# Patient Record
Sex: Female | Born: 2005 | Hispanic: Yes | Marital: Single | Smoking: Never smoker
Health system: Southern US, Community
[De-identification: ages and names within clinical notes are randomized; demographics above are authoritative.]

## PROBLEM LIST (undated history)

## (undated) DIAGNOSIS — D649 Anemia, unspecified: Secondary | ICD-10-CM

## (undated) HISTORY — DX: Anemia, unspecified: D64.9

---
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
## 2023-06-12 ENCOUNTER — Observation Stay (HOSPITAL_COMMUNITY)
Admission: EM | Admit: 2023-06-12 | Discharge: 2023-06-13 | Disposition: A | Discharge status: Home / Self Care | Attending: Emergency Medicine | Admitting: Emergency Medicine

## 2023-06-12 ENCOUNTER — Encounter (HOSPITAL_COMMUNITY): Payer: Self-pay

## 2023-06-12 ENCOUNTER — Other Ambulatory Visit: Payer: Self-pay

## 2023-06-12 DIAGNOSIS — D649 Anemia, unspecified: Secondary | ICD-10-CM | POA: Diagnosis not present

## 2023-06-12 DIAGNOSIS — D500 Iron deficiency anemia secondary to blood loss (chronic): Secondary | ICD-10-CM | POA: Diagnosis not present

## 2023-06-12 DIAGNOSIS — R55 Syncope and collapse: Principal | ICD-10-CM

## 2023-06-12 LAB — COMPREHENSIVE METABOLIC PANEL WITH GFR
ALT: 14 U/L (ref 0–44)
AST: 21 U/L (ref 15–41)
Albumin: 4.1 g/dL (ref 3.5–5.0)
Alkaline Phosphatase: 48 U/L (ref 38–126)
Anion gap: 9 (ref 5–15)
BUN: 6 mg/dL (ref 6–20)
CO2: 23 mmol/L (ref 22–32)
Calcium: 9.2 mg/dL (ref 8.9–10.3)
Chloride: 105 mmol/L (ref 98–111)
Creatinine, Ser: 0.63 mg/dL (ref 0.44–1.00)
GFR, Estimated: >60 mL/min (ref >60)
Glucose, Bld: 102 mg/dL — ABNORMAL HIGH (ref 70–99)
Potassium: 4.1 mmol/L (ref 3.5–5.1)
Sodium: 137 mmol/L (ref 135–145)
Total Bilirubin: 0.6 mg/dL (ref 0.0–1.2)
Total Protein: 7.2 g/dL (ref 6.5–8.1)

## 2023-06-12 LAB — URINALYSIS, ROUTINE W REFLEX MICROSCOPIC
Bilirubin Urine: NEGATIVE
Glucose, UA: NEGATIVE mg/dL
Ketones, ur: NEGATIVE mg/dL
Nitrite: NEGATIVE
Protein, ur: 30 mg/dL — AB
Specific Gravity, Urine: 1.024 (ref 1.005–1.030)
pH: 6 (ref 5.0–8.0)

## 2023-06-12 LAB — HCG, SERUM, QUALITATIVE: Preg, Serum: NEGATIVE

## 2023-06-12 LAB — CBC
HCT: 26.9 % — ABNORMAL LOW (ref 36.0–46.0)
Hemoglobin: 7.4 g/dL — ABNORMAL LOW (ref 12.0–15.0)
MCH: 17.6 pg — ABNORMAL LOW (ref 26.0–34.0)
MCHC: 27.5 g/dL — ABNORMAL LOW (ref 30.0–36.0)
MCV: 63.9 fL — ABNORMAL LOW (ref 80.0–100.0)
Platelets: 346 10*3/uL (ref 150–400)
RBC: 4.21 MIL/uL (ref 3.87–5.11)
RDW: 21.2 % — ABNORMAL HIGH (ref 11.5–15.5)
WBC: 5.9 10*3/uL (ref 4.0–10.5)
nRBC: 0 % (ref 0.0–0.2)

## 2023-06-12 LAB — ABO/RH: ABO/RH(D): O POS

## 2023-06-12 LAB — RETICULOCYTES
Immature Retic Fract: 20.4 % — ABNORMAL HIGH (ref 2.3–15.9)
RBC.: 4.19 MIL/uL (ref 3.87–5.11)
Retic Count, Absolute: 93.4 10*3/uL (ref 19.0–186.0)
Retic Ct Pct: 2.2 % (ref 0.4–3.1)

## 2023-06-12 LAB — CBG MONITORING, ED: Glucose-Capillary: 79 mg/dL (ref 70–99)

## 2023-06-12 LAB — FOLATE: Folate: 20.2 ng/mL (ref >5.9)

## 2023-06-12 LAB — PREPARE RBC (CROSSMATCH)

## 2023-06-12 MED ORDER — ACETAMINOPHEN 650 MG RE SUPP: 650.0000 mg | Freq: Four times a day (QID) | RECTAL | Status: DC | PRN

## 2023-06-12 MED ORDER — ACETAMINOPHEN 325 MG PO TABS: 650.0000 mg | ORAL_TABLET | Freq: Four times a day (QID) | ORAL | Status: DC | PRN

## 2023-06-12 MED ORDER — SODIUM CHLORIDE 0.9% IV SOLUTION: Freq: Once | INTRAVENOUS | Status: DC

## 2023-06-12 MED ORDER — SENNOSIDES-DOCUSATE SODIUM 8.6-50 MG PO TABS
1.0000 | ORAL_TABLET | Freq: Every evening | ORAL | Status: DC | PRN
Start: 2023-06-12 — End: 2023-06-13

## 2023-06-12 NOTE — Hospital Course (Signed)
 Kathryn Andrade is a 18 y.o. woman with a history of menorrhagia who presented with syncope and admitted on 6/1 with severe blood loss anemia. She received 2u PRBCs and discharged to home on 06/13/23.   Syncope 2/2 chronic blood loss and iron-deficient anemia History of menorrhagia Presented with hemoglobin of 7.4, symptomatic anemia with shortness of breath, lightheadedness, and syncopal episode. No other evident sources of bleeding. Suspect she is chronically anemic as a result of menorrhagia. History not consistent with an underlying clotting issue. Very iron deficient on iron studies, and consistently low MCV. She responded well to 2u PRBC as well as 500mg  venofer iron infusion. - Discharge with PCP visit planned on 6/5. - Outgoing meds: ferrous sulfate 325 daily as tolerated and Junel OCP to improve menorrhagia. - Consider referral to OB/GYN upon pt/provider discretion - Ref made for outpatient IV iron infusion - Rec high iron diet

## 2023-06-12 NOTE — ED Provider Notes (Signed)
 [place] EMERGENCY DEPARTMENT AT [facility] Provider Note   CSN: [id number] Arrival date & time: 06/12/23  1400     History  Chief Complaint  Patient presents with   Loss of Consciousness    Kathryn Andrade is a 18 y.o. female.  Patient reports that she passed out twice today.  Patient reports that she was in the shower and she bent over.  Patient began experiencing shortness of breath.  Patient states that she rinsed off and got out of the shower.  Patient states she felt herself becoming weak.  Patient reports falling to the ground.  She states she hit her chin.  Patient reports that she was able to get up and then she had a second episode.  Patient reports that she did not lose consciousness the second time she fell she just felt weak and collapsed to the ground.  Patient reports the first time she may have lost consciousness.  Patient reports experiencing shortness of breath and feeling like she was breathing hard before the episode happened.  Patient is here with her mother.  Her mother states that she eats a lot of ice.  Mother is concerned that she is anemic.  The history is provided by the patient. No language interpreter was used.  Loss of Consciousness Episode history:  Multiple Most recent episode:  Today Duration:  5 seconds Progression:  Unchanged Chronicity:  New Context: not sight of blood   Relieved by:  Nothing Worsened by:  Nothing Associated symptoms: no weakness        Home Medications Prior to Admission medications   Medication Sig Start Date End Date Taking? Authorizing Provider  baclofen  (LIORESAL ) 10 MG tablet Take 1 tablet (10 mg total) by mouth 2 (two) times daily. 08/17/21   Raspet, Erin K, PA-C      Allergies    Patient has no known allergies.    Review of Systems   Review of Systems  Cardiovascular:  Positive for syncope.  Neurological:  Negative for weakness.  All other systems reviewed and are negative.   Physical  Exam Updated Vital Signs BP 128/66 (BP Location: Right Arm)   Pulse (!) 101   Temp 98.7 F (37.1 C)   Resp 14   Ht 5\' 3"  (1.6 m)   Wt 49.9 kg   SpO2 100%   BMI 19.49 kg/m  Physical Exam Vitals and nursing note reviewed.  Constitutional:      Appearance: She is well-developed.  HENT:     Head: Normocephalic.  Cardiovascular:     Rate and Rhythm: Normal rate.  Pulmonary:     Effort: Pulmonary effort is normal.  Abdominal:     General: There is no distension.  Musculoskeletal:        General: Normal range of motion.     Cervical back: Normal range of motion.  Skin:    General: Skin is warm.  Neurological:     General: No focal deficit present.     Mental Status: She is alert and oriented to person, place, and time.     ED Results / Procedures / Treatments   Labs (all labs ordered are listed, but only abnormal results are displayed) Labs Reviewed  CBC - Abnormal; Notable for the following components:      Result Value   Hemoglobin 7.4 (*)    HCT 26.9 (*)    MCV 63.9 (*)    MCH 17.6 (*)    MCHC 27.5 (*)  RDW 21.2 (*)    All other components within normal limits  URINALYSIS, ROUTINE W REFLEX MICROSCOPIC - Abnormal; Notable for the following components:   Color, Urine AMBER (*)    APPearance HAZY (*)    Hgb urine dipstick SMALL (*)    Protein, ur 30 (*)    Leukocytes,Ua MODERATE (*)    Bacteria, UA RARE (*)    All other components within normal limits  COMPREHENSIVE METABOLIC PANEL WITH GFR  HCG, SERUM, QUALITATIVE  VITAMIN B12  FOLATE  IRON AND TIBC  FERRITIN  RETICULOCYTES  CBG MONITORING, ED    EKG None  Radiology No results found.  Procedures Procedures    Medications Ordered in ED Medications - No data to display  ED Course/ Medical Decision Making/ A&P Clinical Course as of 06/12/23 1529  Sun Jun 12, 2023  1520 2 syncopal episodes today. Eats lots of ice. Pale appearance. Low Hgb. Hx of menorrhagia. Tachycardic. Had 1 episode of LOC.  Dyspnea on exertion. Not complaining of pain. Seek to admit and workup for symptomatic anemia.  [CB]    Clinical Course User Index [CB] Hayes Lipps, PA-C                                 Medical Decision Making Patient complains of having 2 syncopal episodes today.  Patient's mother is concerned that she is anemic.  Amount and/or Complexity of Data Reviewed Independent Historian:     Details: Patient is here with her mother who is supportive Labs: ordered. Decision-making details documented in ED Course.    Details: Hemoglobin is 7.4. Anemia panel ordered  ECG/medicine tests: ordered and independent interpretation performed. Decision-making details documented in ED Course.    Details: EKG normal sinus normal EKG           Final Clinical Impression(s) / ED Diagnoses Final diagnoses:  Syncope and collapse  Anemia, unspecified type    Rx / DC Orders ED Discharge Orders     None         Chevella Pearce K, PA-C 06/12/23 1529    Carin Charleston, MD 06/13/23 1536

## 2023-06-12 NOTE — ED Triage Notes (Signed)
 Pt arrived POV from home this morning after having 2 syncopal episodes. Pt states she was in the shower and started heavy breathing and feeling funny she got out and tried to get her phone and blacked out and hit her chin. Pt states she woke up on the floor tried to get up and open the door and blacked out again and her grandma found her on the bathroom floor.

## 2023-06-12 NOTE — Progress Notes (Signed)
 New Admission Note:   Arrival Method: stretcher Mental Orientation: aa+ox4 Telemetry: box 22 Assessment: Completed Skin: c/d/i IV: left arm blood infusing Pain:denies Tubes: n/a Safety Measures: Safety Fall Prevention Plan has been given, discussed and signed Admission: Completed [facility] Orientation: Patient has been orientated to the room, unit and staff.  Family: present  Orders have been reviewed and implemented. Will continue to monitor the patient. Call light has been placed within reach and bed alarm has been activated.   Elvina Hammers, RN

## 2023-06-12 NOTE — ED Provider Notes (Signed)
  Physical Exam  BP 128/66 (BP Location: Right Arm)   Pulse (!) 101   Temp 98.7 F (37.1 C)   Resp 14   Ht 5\' 3"  (1.6 m)   Wt 49.9 kg   SpO2 100%   BMI 19.49 kg/m   Physical Exam Vitals and nursing note reviewed.  Constitutional:      General: She is not in acute distress.    Appearance: Normal appearance. She is not ill-appearing.  HENT:     Head: Normocephalic and atraumatic.     Mouth/Throat:     Comments: Pale gingiva Eyes:     Extraocular Movements: Extraocular movements intact.     Conjunctiva/sclera: Conjunctivae normal.     Comments: Pale mucous membranes  Cardiovascular:     Rate and Rhythm: Regular rhythm. Tachycardia present.  Pulmonary:     Effort: Pulmonary effort is normal. No respiratory distress.  Abdominal:     General: Abdomen is flat. There is no distension.     Tenderness: There is no abdominal tenderness. There is left CVA tenderness. There is no right CVA tenderness, guarding or rebound.  Skin:    General: Skin is warm and dry.     Coloration: Skin is pale.  Neurological:     Mental Status: She is alert.     Procedures  Procedures  ED Course / MDM   Clinical Course as of 06/12/23 1609  Sun Jun 12, 2023  1520 2 syncopal episodes today. Eats lots of ice. Pale appearance. Low Hgb. Hx of menorrhagia. Tachycardic. Had 1 episode of LOC. Dyspnea on exertion. Not complaining of pain. Seek to admit and workup for symptomatic anemia.  [CB]    Clinical Course User Index [CB] Hayes Lipps, PA-C   Medical Decision Making Amount and/or Complexity of Data Reviewed Labs: ordered.   Patient care handed over from [facility].  At time of handoff, plan was awaiting labs and seeking to admit for symptomatic anemia with a hemoglobin of 7.4, needing more anemic workup and possible future transfusions.  Noted to have had 1 episode of syncope and another episode of near syncope earlier today, hitting chin on the shower with LOC.  Noted to have been  accompanied with dyspnea on exertion.  Patient has had a history of menorrhagia, noted to have become more pale and mother reporting lots of ice consumption. Currently not reporting any pain at this time. Anemia workup pending at this time.  Suspecting this may be secondary to the menorrhagia. LMP was 06/07/2023.  On evaluation, patient is still borderline tachycardic with HR of low 100s at rest.  Nontachypneic.  [place] C-spine and head CT negative.  Patient does not have good follow-up at this time.  With no PCP noted.  With patient's current symptoms, will administer 2 units of blood and see to admit.  Working up for anemia.  Consult with hospitalist, patient care was transferred over to hospitalist and patient was admitted to the hospital.  Patient care transferred to Dr. Jarvis Mesa.     Hayes Lipps, [place] 06/12/23 1804    Dorenda Gandy, MD 06/12/23 [phone]

## 2023-06-12 NOTE — H&P (Signed)
 Date: 06/12/2023               Patient Name:  Kathryn Andrade MRN: [id number]  DOB: 2005/02/21 Age / Sex: 18 y.o., female   PCP: Pcp, No         Medical Service: Internal Medicine Teaching Service         Attending Physician: Dr. Driscilla George, MD      First Contact: Dorthy Gavia, MD    Second Contact: Jearldine Mina, DO         After Hours (After 5p/  First Contact Pager: [phone]  weekends / holidays): Second Contact Pager: [phone]   SUBJECTIVE   Chief Complaint: passed out in shower  History of Present Illness: Kathryn Andrade is a 18 year old female with a past medical history of menorrhagia who presented following an episode of syncope and was found to have symptomatic anemia. Her mother is bedside and contributed to the history.   She had one episode of syncope this morning with loss of consciousness, and a second episode of near syncope following it. Also reports shortness of breath and weakness today. Occurred in shower, bent forward and felt lightheaded and short of breath. LOC and fell forward without head injury. States she hit her chin when she fell but braced herself with her arms. Family at home and assisted her. Regained consciousness and no post-ictal symptoms  Of note, she has a known history of menorrhagia and intermenstrual bleeding, for which she has followed with [facility] ob/gyn in the past. .  Her menstrual period ended two days ago. Her cycles are every month and lasts about 6-7 days since 11/18 yo. She uses pads and uses about 4-5 on heavy days. She is not currently on any birth control and would prefer not to use any.  She denies any abdominal pain, nausea, vomiting.  She denies any blood in her stool or hematuria. She takes oral iron occasionally, but has felt in the past that it does not help her so she has not been taking it recently.   ED Course: Hemodynamically stable, afebrile, HR normal CBC with anemia, Hgb 7.4 from baseline ~13 CMP  unremarkable Retic 2.2 Urinalysis with WBC, leukocytes, nitrite negative, squames Ferritin, iron and TIBC, B12 pending 2u pRBC ordered  Meds:  -iron supplement but not consistently   Past Medical History: Menorrhagia  Past Surgical History: History reviewed. No pertinent surgical history.  Social:  Lives With: mother Support: family Level of Function: independent in ADL and IADL PCP: None  Substances: -Tobacco denies -Alcohol denies  -Recreational Drug denies   Family History: History reviewed. No pertinent family history.  Allergies: Allergies as of 06/12/2023   (No Known Allergies)   Review of Systems: A complete ROS was negative except as per HPI.   OBJECTIVE:   Physical Exam: Blood pressure 128/66, pulse (!) 101, temperature 98.7 F (37.1 C), resp. rate 14, height 5\' 3"  (1.6 m), weight 49.9 kg, SpO2 100%.  Constitutional: well-appearing young female, sitting up in bed no acute distress, vomitus bedside HENT: Mucosal pallor Eyes: Conjunctival pallor Cardiovascular: regular rate and rhythm, no murmurs, extremities well-perfused Pulmonary/Chest: normal work of breathing on room air, lungs clear to auscultation bilaterally Abdominal: soft, non-distended MSK: normal bulk and tone Neurological: alert & oriented x 3, no focal deficits Skin: warm and dry, but appears pale Psych: Normal mood and affect  Labs: CBC    Component Value Date/Time   WBC 5.9 06/12/2023 1413   RBC 4.19 06/12/2023  1530   RBC 4.21 06/12/2023 1413   HGB 7.4 (L) 06/12/2023 1413   HCT 26.9 (L) 06/12/2023 1413   PLT 346 06/12/2023 1413   MCV 63.9 (L) 06/12/2023 1413   MCH 17.6 (L) 06/12/2023 1413   MCHC 27.5 (L) 06/12/2023 1413   RDW 21.2 (H) 06/12/2023 1413   LYMPHSABS 1.4 05/24/2021 1345   MONOABS 0.5 05/24/2021 1345   EOSABS 0.0 05/24/2021 1345   BASOSABS 0.1 05/24/2021 1345     CMP     Component Value Date/Time   NA 137 06/12/2023 1413   K 4.1 06/12/2023 1413   CL 105  06/12/2023 1413   CO2 23 06/12/2023 1413   GLUCOSE 102 (H) 06/12/2023 1413   BUN 6 06/12/2023 1413   CREATININE 0.63 06/12/2023 1413   CALCIUM 9.2 06/12/2023 1413   PROT 7.2 06/12/2023 1413   ALBUMIN 4.1 06/12/2023 1413   AST 21 06/12/2023 1413   ALT 14 06/12/2023 1413   ALKPHOS 48 06/12/2023 1413   BILITOT 0.6 06/12/2023 1413   GFRNONAA >60 06/12/2023 1413    Imaging: US  PELVIS (TRANSABDOMINAL ONLY) CLINICAL DATA:  Pelvic pain  EXAM: TRANSABDOMINAL ULTRASOUND OF PELVIS  DOPPLER ULTRASOUND OF OVARIES  TECHNIQUE: Transabdominal ultrasound examination of the pelvis was performed including evaluation of the uterus, ovaries, adnexal regions, and pelvic cul-de-sac.  Color and duplex Doppler ultrasound was utilized to evaluate blood flow to the ovaries.  COMPARISON:  None Available.  FINDINGS: Uterus  Measurements: 8.2 x 4.5 x 4.8 cm = volume: 91.5 mL. No fibroids or other mass visualized.  Endometrium  Thickness: 14 mm.  No focal abnormality visualized.  Right ovary  Measurements: 3.8 x 3.0 x 2.5 cm = volume: 15.0 mL. Normal follicles are identified. No adnexal masses.  Left ovary  Measurements: 2.8 x 1.6 x 1.7 cm = volume: 4.0 mL. Normal follicles are identified. No adnexal masses.  Pulsed Doppler evaluation demonstrates normal low-resistance arterial and venous waveforms in both ovaries.  Other: Small volume of free fluid within the pelvis, may be physiologic.  IMPRESSION: 1. Age-appropriate pelvic ultrasound. 2. Pelvic free fluid, likely physiologic.  Electronically Signed   By: Bobbye Burrow M.D.   On: 05/24/2021 17:30 US  PELVIC DOPPLER (TORSION R/O OR MASS ARTERIAL FLOW) CLINICAL DATA:  Pelvic pain  EXAM: TRANSABDOMINAL ULTRASOUND OF PELVIS  DOPPLER ULTRASOUND OF OVARIES  TECHNIQUE: Transabdominal ultrasound examination of the pelvis was performed including evaluation of the uterus, ovaries, adnexal regions, and pelvic  cul-de-sac.  Color and duplex Doppler ultrasound was utilized to evaluate blood flow to the ovaries.  COMPARISON:  None Available.  FINDINGS: Uterus  Measurements: 8.2 x 4.5 x 4.8 cm = volume: 91.5 mL. No fibroids or other mass visualized.  Endometrium  Thickness: 14 mm.  No focal abnormality visualized.  Right ovary  Measurements: 3.8 x 3.0 x 2.5 cm = volume: 15.0 mL. Normal follicles are identified. No adnexal masses.  Left ovary  Measurements: 2.8 x 1.6 x 1.7 cm = volume: 4.0 mL. Normal follicles are identified. No adnexal masses.  Pulsed Doppler evaluation demonstrates normal low-resistance arterial and venous waveforms in both ovaries.  Other: Small volume of free fluid within the pelvis, may be physiologic.  IMPRESSION: 1. Age-appropriate pelvic ultrasound. 2. Pelvic free fluid, likely physiologic.  Electronically Signed   By: Bobbye Burrow M.D.   On: 05/24/2021 17:30  EKG: personally reviewed my interpretation is normal sinus rhythm  ASSESSMENT & PLAN:  Assessment & Plan by Problem: Principal Problem:   Symptomatic  anemia  Heavin Sebree is a 18 y.o. person living with a history of menorrhagia who presented with syncope and is admitted for symptomatic anemia on hospital day 0.   Syncope 2/2 Symptomatic anemia History of menorrhagia Presented with hemoglobin of 7.4, symptomatic anemia with shortness of breath, lightheadedness, and syncopal episode. This is most likely secondary to uterine bleeding given recent completion of menstrual cycle and known menorrhagia. No other evident sources of bleeding. Suspect she is chronically anemic as a result. We will transfuse her and set her up for outpatient follow up.  - 2 units PRBC  - Cardiac telemetry while being transfused - Follow up post transfusion H/H, morning CBC - Follow-up vitamin B12, iron panel - Patient would like guidance about high iron diet at discharge - Consider IV iron ambulatory  referral - Will need to follow up with OB/gyn outpatient; also needs a PCP  Diet: Normal VTE: None IVF: None Code: Full Surrogate Decision Maker: mother Lita Rieger)  Prior to Admission Living Arrangement: Home, living with mother Anticipated Discharge Location: Home Barriers to Discharge: resolution of anemia  Dispo: Admit patient to Observation with expected length of stay less than 2 midnights.  Signed: Dorthy Gavia, MD Internal Medicine Resident, PGY-1 [facility]   06/12/2023, 6:20 PM

## 2023-06-12 NOTE — ED Notes (Signed)
 Blood consent signed prior to blood administration. Pt educated on signs and symptoms of transfusion reaction. Pt reports no previous history of transfusions.   Pt aox4, GCS 15, non-cyanotic, warm, dry, moving all extremities appropriately with no acute signs of distress.   Pt sitting upright using phone, family at bedside.

## 2023-06-12 NOTE — Plan of Care (Signed)

## 2023-06-12 NOTE — ED Notes (Signed)
 15 minute v/s for transfusion documented as noted.

## 2023-06-13 ENCOUNTER — Other Ambulatory Visit (HOSPITAL_COMMUNITY): Payer: Self-pay

## 2023-06-13 ENCOUNTER — Telehealth: Payer: Self-pay | Admitting: Internal Medicine

## 2023-06-13 ENCOUNTER — Telehealth: Payer: Self-pay

## 2023-06-13 DIAGNOSIS — D500 Iron deficiency anemia secondary to blood loss (chronic): Secondary | ICD-10-CM | POA: Diagnosis not present

## 2023-06-13 DIAGNOSIS — R55 Syncope and collapse: Secondary | ICD-10-CM | POA: Diagnosis not present

## 2023-06-13 DIAGNOSIS — D649 Anemia, unspecified: Secondary | ICD-10-CM | POA: Diagnosis not present

## 2023-06-13 LAB — TYPE AND SCREEN
ABO/RH(D): O POS
Antibody Screen: NEGATIVE
Unit division: 0
Unit division: 0

## 2023-06-13 LAB — BPAM RBC
Blood Product Expiration Date: 202506282359
Blood Product Expiration Date: 202506282359
ISSUE DATE / TIME: 202506011732
ISSUE DATE / TIME: 202506012049
Unit Type and Rh: 5100
Unit Type and Rh: 5100

## 2023-06-13 LAB — IRON AND TIBC
Iron: 21 ug/dL — ABNORMAL LOW (ref 28–170)
Saturation Ratios: 4 % — ABNORMAL LOW (ref 10.4–31.8)
TIBC: 526 ug/dL — ABNORMAL HIGH (ref 250–450)
UIBC: 505 ug/dL

## 2023-06-13 LAB — HIV ANTIBODY (ROUTINE TESTING W REFLEX): HIV Screen 4th Generation wRfx: NONREACTIVE

## 2023-06-13 LAB — HEMOGLOBIN AND HEMATOCRIT, BLOOD
HCT: 35.6 % — ABNORMAL LOW (ref 36.0–46.0)
Hemoglobin: 10.9 g/dL — ABNORMAL LOW (ref 12.0–15.0)

## 2023-06-13 LAB — VITAMIN B12: Vitamin B-12: 553 pg/mL (ref 180–914)

## 2023-06-13 LAB — FERRITIN: Ferritin: <3 ng/mL — ABNORMAL LOW (ref 11–307)

## 2023-06-13 MED ORDER — SODIUM CHLORIDE 0.9 % IV SOLN
500.0000 mg | Freq: Once | INTRAVENOUS | Status: AC
  Administered 2023-06-13: 500 mg via INTRAVENOUS
  Filled 2023-06-13: qty 25

## 2023-06-13 MED ORDER — ORAL CARE MOUTH RINSE: 15.0000 mL | OROMUCOSAL | Status: DC | PRN

## 2023-06-13 MED ORDER — NORETHINDRONE ACET-ETHINYL EST 1.5-30 MG-MCG PO TABS: 1.0000 | ORAL_TABLET | Freq: Every day | ORAL | 0 refills | Status: AC

## 2023-06-13 MED ORDER — FERROUS SULFATE 325 (65 FE) MG PO TBEC: 325.0000 mg | DELAYED_RELEASE_TABLET | Freq: Every day | ORAL | 0 refills | Status: DC

## 2023-06-13 NOTE — Progress Notes (Signed)
 DISCHARGE NOTE HOME Kathryn Andrade to be discharged Home per MD order. Discussed prescriptions and follow up appointments with the patient. Prescriptions given to patient; medication list explained in detail. Patient verbalized understanding.  Skin clean, dry and intact without evidence of skin break down, no evidence of skin tears noted. IV catheter discontinued intact. Site without signs and symptoms of complications. Dressing and pressure applied. Pt denies pain at the site currently. No complaints noted.  Patient free of lines, drains, and wounds.   An After Visit Summary (AVS) was printed and given to the patient. Patient escorted via wheelchair to discharge lounge.   Keli Buehner A Proctor-Gann, RN

## 2023-06-13 NOTE — Plan of Care (Signed)
  Problem: Health Behavior/Discharge Planning: Goal: Ability to manage health-related needs will improve Outcome: Adequate for Discharge   Problem: Clinical Measurements: Goal: Ability to maintain clinical measurements within normal limits will improve Outcome: Adequate for Discharge Goal: Will remain free from infection Outcome: Adequate for Discharge Goal: Diagnostic test results will improve Outcome: Adequate for Discharge Goal: Respiratory complications will improve Outcome: Adequate for Discharge Goal: Cardiovascular complication will be avoided Outcome: Adequate for Discharge   Problem: Activity: Goal: Risk for activity intolerance will decrease Outcome: Adequate for Discharge   Problem: Nutrition: Goal: Adequate nutrition will be maintained Outcome: Adequate for Discharge   Problem: Coping: Goal: Level of anxiety will decrease Outcome: Adequate for Discharge   Problem: Elimination: Goal: Will not experience complications related to bowel motility Outcome: Adequate for Discharge Goal: Will not experience complications related to urinary retention Outcome: Adequate for Discharge   Problem: Pain Managment: Goal: General experience of comfort will improve and/or be controlled Outcome: Adequate for Discharge   Problem: Safety: Goal: Ability to remain free from injury will improve Outcome: Adequate for Discharge   Problem: Skin Integrity: Goal: Risk for impaired skin integrity will decrease Outcome: Adequate for Discharge

## 2023-06-13 NOTE — Telephone Encounter (Signed)
 Patient referred to infusion pharmacy team for ambulatory infusion of IV iron.  Insurance - [place] [employer] Prepaid  Dx code - D50.0 IV Iron Therapy - Feraheme 510 mg IV x 2  Infusion appointments - [facility] Scheduling team will schedule patient as soon as possible.    Kathryn Andrade, PharmD

## 2023-06-13 NOTE — TOC Transition Note (Signed)
 Transition of Care [facility]) - Discharge Note   Patient Details  Name: Kathryn Andrade MRN: [id number] Date of Birth: 15-Aug-2005  Transition of Care [facility]) CM/SW Contact:  Tom-Johnson, Kasie Leccese Daphne, RN Phone Number: 06/13/2023, 2:20 PM   Clinical Narrative:     Patient is scheduled for discharge today.  New patient establishment, hospital f/u and discharge instructions on AVS. Parents to transport at discharge.  No further TOC needs noted.         Final next level of care: Home/Self Care Barriers to Discharge: Barriers Resolved   Patient Goals and CMS Choice Patient states their goals for this hospitalization and ongoing recovery are:: To return home CMS Medicare.gov Compare Post Acute Care list provided to:: Patient    ownership interest in [facility].provided to:: Patient    Discharge Placement                Patient to be transferred to facility by: Parents      Discharge Plan and Services Additional resources added to the After Visit Summary for   In-house Referral: PCP / Health Connect Discharge Planning Services: CM Consult, Follow-up appt scheduled            DME Arranged: N/A DME Agency: NA       HH Arranged: NA HH Agency: NA        Social Drivers of Health (SDOH) Interventions SDOH Screenings   Food Insecurity: No Food Insecurity (06/12/2023)  Housing: Low Risk  (06/12/2023)  Transportation Needs: No Transportation Needs (06/12/2023)  Utilities: Not At Risk (06/12/2023)  Depression (PHQ2-9): Low Risk  (07/08/2021)  Tobacco Use: Low Risk  (06/12/2023)     Readmission Risk Interventions     No data to display

## 2023-06-13 NOTE — Discharge Summary (Signed)
 Name: Kathryn Andrade MRN: [id number] DOB: April 13, 2005 18 y.o. PCP: Pcp, No  Date of Admission: 06/12/2023  2:01 PM Date of Discharge:  06/13/23 Attending Physician: Dr. Jarvis Mesa  DISCHARGE DIAGNOSIS:  Primary Problem: Symptomatic anemia   Hospital Problems: Principal Problem:   Symptomatic anemia Active Problems:   Iron deficiency anemia due to chronic blood loss   Syncope and collapse    DISCHARGE MEDICATIONS:   Allergies as of 06/13/2023   No Known Allergies      Medication List     STOP taking these medications    baclofen  10 MG tablet Commonly known as: LIORESAL        TAKE these medications    ferrous sulfate 325 (65 FE) MG EC tablet Take 1 tablet (325 mg total) by mouth daily. Can take every other day if causes persistent diarrhea.   Norethindrone Acetate-Ethinyl Estradiol 1.5-30 MG-MCG tablet Commonly known as: Junel 1.5/30 Take 1 tablet by mouth daily.        DISPOSITION AND FOLLOW-UP:  Ms.Kathryn Andrade was discharged from [facility] in stable condition. At the hospital follow up visit please address:  Menorrhagia causing chronic blood loss anemia with iron deficiency causing a syncopal episode  Follow-up Recommendations: Labs: CBC Medications:  Started iron ferrous sulfate 325 daily (or every other day per GI side effects) as well as Junel 1.5/30 daily for menorrhagia.  Please consider extending these medicines alongside evaluation by OB/GYN as indicated.   Prior OB/GYN workup in 2023, includiing pelvic ultrasound, unrevealing with rec for OCP. She expressed a desire to get a second opinion in the near future. History not consistent with bleeding disorder.  Follow-up Appointments: 06/16/23 with Dr. Lear Prosper at [facility] COURSE:  Patient Summary: Kathryn Andrade is a 18 y.o. woman with a history of menorrhagia who presented with syncope and admitted on 6/1 with severe blood loss anemia. She  received 2u PRBCs and discharged to home on 06/13/23.   Syncope 2/2 chronic blood loss and iron-deficient anemia History of menorrhagia Presented with hemoglobin of 7.4, symptomatic anemia with shortness of breath, lightheadedness, and syncopal episode. No other evident sources of bleeding. Suspect she is chronically anemic as a result of menorrhagia. History not consistent with an underlying clotting issue. Very iron deficient on iron studies, and consistently low MCV. She responded well to 2u PRBC as well as 500mg  venofer iron infusion. - Discharge with PCP visit planned on 6/5. - Outgoing meds: ferrous sulfate 325 daily as tolerated and Junel OCP to improve menorrhagia. - Consider referral to OB/GYN upon pt/provider discretion - Ref made for outpatient IV iron infusion - Rec high iron diet   DISCHARGE INSTRUCTIONS:   Discharge Instructions     Amb Referral to Intravenous Iron Therapy   Complete by: As directed    You have been referred to [facility] Infusion team for IV Iron Infusions. The infusion pharmacy team will reach out to you with appointment information.    Primary Diagnosis Code for IV Iron: D50.9 - Iron deficiency Anemia   Secondary diagnosis code for IV iron: Other   Comment: IDA 2/2 chronic blood loss   Call MD for:  difficulty breathing, headache or visual disturbances   Complete by: As directed    Call MD for:  persistant dizziness or light-headedness   Complete by: As directed    Diet general   Complete by: As directed    Discharge instructions   Complete by: As directed  You were hospitalized due to a passing out episode, or "syncope," due to anemia. Your anemia is due to heavy menstrual bleeding. It is good to see that you are recovering after receiving a blood transfusion.  Persistent dizziness, lightheadedness, chills, shortness of breath, or fatigue are signs that your anemia might be worsening again. If that happens, contact your new primary care doctor. If  severe, go to the ER.  Going forward, I recommend taking iron "ferrous sulfate" to help further rebuild your blood. You may experience diarrhea at first from the iron but that may go away. Take the iron daily or reduce to every other day if the diarrhea persists. Iron will not help your bleeding, for that see the other medicine below.  There are good internet sources for high iron foods - such as the [employer] Iron-Rich Food Guide. Some good options are beef, ham, chicken, eggs, leafy green vegetables, and beans.  Also, please start a medicine called Junel. This is a type of oral contraceptive pill but it has other uses such as to improve severe menstrual bleeding, as in your case. Take one pill daily.  Your medicines should be ready for pickup at [facility] [address] later today.  At your primary care visit (information below), please talk about continuing these medicines and possible referral to an OB/GYN, if that is what you and your new doctor think is best.  Your primary care appointment: 06/16/2023 at 3:20 PM (Arrive by 3:05 PM)  Amadeo June, MD  [facility] [address] [place] [address]   Increase activity slowly   Complete by: As directed        SUBJECTIVE:  She feels well this morning. Has been on her feet in the room without concern. Denies ongoing bleeding. Agreeable to try iron supplement and OCP until outpatient eval.  Discharge Vitals:   BP (!) 119/58 (BP Location: Right Arm)   Pulse 76   Temp 97.9 F (36.6 C) (Oral)   Resp 19   Ht 5\' 3"  (1.6 m)   Wt 47.1 kg   SpO2 100%   BMI 18.39 kg/m   OBJECTIVE:  Physical Exam Constitutional:      General: She is not in acute distress.    Appearance: She is not ill-appearing.  Cardiovascular:     Rate and Rhythm: Normal rate.  Pulmonary:     Effort: Pulmonary effort is normal.  Abdominal:     General: Abdomen is flat.  Musculoskeletal:     Right lower leg: No edema.      Left lower leg: No edema.  Skin:    General: Skin is warm and dry.  Neurological:     General: No focal deficit present.     Mental Status: She is alert and oriented to person, place, and time.  Psychiatric:        Mood and Affect: Mood normal.        Behavior: Behavior normal.     Pertinent Labs, Studies, and Procedures:     Latest Ref Rng & Units 06/13/2023    4:00 AM 06/12/2023    2:13 PM 05/24/2021    1:45 PM  CBC  WBC 4.0 - 10.5 K/uL  5.9  8.6   Hemoglobin 12.0 - 15.0 g/dL 60.4  7.4  54.0   Hematocrit 36.0 - 46.0 % 35.6  26.9  38.7   Platelets 150 - 400 K/uL  346  231  Latest Ref Rng & Units 06/12/2023    2:13 PM  CMP  Glucose 70 - 99 mg/dL 161   BUN 6 - 20 mg/dL 6   Creatinine 0.96 - 0.45 mg/dL 4.09   Sodium 811 - 914 mmol/L 137   Potassium 3.5 - 5.1 mmol/L 4.1   Chloride 98 - 111 mmol/L 105   CO2 22 - 32 mmol/L 23   Calcium 8.9 - 10.3 mg/dL 9.2   Total Protein 6.5 - 8.1 g/dL 7.2   Total Bilirubin 0.0 - 1.2 mg/dL 0.6   Alkaline Phos 38 - 126 U/L 48   AST 15 - 41 U/L 21   ALT 0 - 44 U/L 14     No results found.   Signed: Carleen Chary, DO Internal Medicine Resident, PGY-1 [facility]  2:20 PM, 06/13/2023

## 2023-06-13 NOTE — Telephone Encounter (Signed)
 Auth Submission: NO AUTH NEEDED Site of care: Site of care: [facility] Payer: [employer] medicaid Medication & CPT/J Code(s) submitted: Feraheme (ferumoxytol) [id number] Route of submission (phone, fax, portal):  Phone # Fax # Auth type: Buy/Bill PB Units/visits requested: 510mg  x 2 doses Reference number:  Approval from: 06/13/23 to 10/13/23

## 2023-06-16 ENCOUNTER — Encounter: Payer: Self-pay | Admitting: Family Medicine

## 2023-06-16 ENCOUNTER — Ambulatory Visit: Admitting: Family Medicine

## 2023-06-16 VITALS — BP 115/58 | HR 83 | Temp 97.6°F | Ht 63.0 in | Wt 106.2 lb

## 2023-06-16 DIAGNOSIS — D500 Iron deficiency anemia secondary to blood loss (chronic): Secondary | ICD-10-CM | POA: Diagnosis not present

## 2023-06-16 DIAGNOSIS — R55 Syncope and collapse: Secondary | ICD-10-CM

## 2023-06-16 DIAGNOSIS — F419 Anxiety disorder, unspecified: Secondary | ICD-10-CM | POA: Diagnosis not present

## 2023-06-16 DIAGNOSIS — N920 Excessive and frequent menstruation with regular cycle: Secondary | ICD-10-CM

## 2023-06-16 MED ORDER — FLUOXETINE HCL 10 MG PO CAPS: 10.0000 mg | ORAL_CAPSULE | Freq: Every day | ORAL | 1 refills | Status: DC

## 2023-06-16 NOTE — Addendum Note (Signed)
 Addended by: Nilsa Bash on: 06/16/2023 04:46 PM   Modules accepted: Level of Service

## 2023-06-16 NOTE — Progress Notes (Addendum)
 Patient Office Visit  Assessment & Plan:  Syncope, unspecified syncope type  Menorrhagia with regular cycle -     TSH  Iron deficiency anemia due to chronic blood loss  Anxiety -     FLUoxetine HCl; Take 1 capsule (10 mg total) by mouth daily.  Dispense: 90 capsule; Refill: 1 -     Ambulatory referral to Psychology   Assessment and Plan    Iron Deficiency Anemia Hospitalized for syncope due to severe iron deficiency anemia. Hemoglobin improved post-transfusion. Anemia likely from menorrhagia. Symptoms of pica noted. Transient post-transfusion palpitations resolved. - Continue oral iron supplementation daily. - Continue birth control pills. - Schedule outpatient IV iron infusion next week. - Encourage a high iron diet. - Monitor hemoglobin levels.  Menorrhagia Heavy menstrual bleeding likely contributing to anemia. Birth control initiated to manage bleeding. - Continue birth control pills. - Consider referral to OB/GYN if symptoms persist.  Eating Disorder/anxiety Signs of eating disorder with food restriction, weight concerns, and anxiety. Discussed Prozac and therapy benefits. - Start Prozac 10 mg daily. - Refer to psychology for therapy. - Check thyroid function tests. - Schedule follow-up in 4-6 weeks.  General Health Maintenance Potentially not up to date on vaccinations. - Review immunization status at next visit.          Return in about 4 weeks (around 07/14/2023), or if symptoms worsen or fail to improve.   Subjective:     Patient ID: Kathryn Andrade, female    DOB: 12/15/2005  Age: 18 y.o. MRN: [id number]  Chief Complaint  Patient presents with   Hospitalization Follow-up    Hospital Discharge from 06/12/2023 Syncope and Collapse. Pt. Has been DX w/ anemia, referred to IV treatment for infusion.  Swollen vein and soreness from infusion    HPI Discussed the use of AI scribe software for clinical note transcription with the patient, who gave  verbal consent to proceed.  History of Present Illness        Kathryn Andrade is a 18 year old female with iron deficiency anemia who presents with a history of syncope and heavy menstrual bleeding, follow up hospitalization  She was hospitalized for one night following a syncopal episode due to severe iron deficiency anemia which was her first instance of losing consciousness. She has a history of lightheadedness and feeling cold. During her hospital stay, she received two units of blood and IV iron, which increased her hemoglobin from 7 to 10.9.  She has experienced heavy menstrual bleeding since sixth grade, with the first three days of her period being particularly heavy. Prior to receiving the blood transfusion, she experienced cravings for ice and felt cold. She takes one iron pill daily. After the transfusion, she experienced palpitations and a sensation of electric shocks in her chest, which have since resolved.  She has a history of dietary restrictions and concerns about weight gain, eating one to two meals a day and restricting calorie intake due to fear of gaining weight. She struggles with body image, feeling torn between being 'too small' and 'too big'. She has a history of depression, particularly during the COVID-19 pandemic, and is concerned about her ability to gain weight.  She works as a Conservation officer, nature at [employer] and is not currently attending school. Her family has encouraged her to pursue a career in real estate, but she is not interested. She lives in [place] and has been in the area since she was [age] years old. Her  parents are from [place] Physical Exam MEASUREMENTS: Weight- 103. Results LABS Hb: 7 (06/12/2023) Hct: 26 (06/12/2023) Hb: 10.9 (06/13/2023) HIV: negative (06/12/2023) Assessment & Plan Iron Deficiency Anemia Hospitalized for syncope due to severe iron deficiency anemia. Hemoglobin improved post-transfusion. Anemia likely from  menorrhagia. Symptoms of pica noted. Transient post-transfusion palpitations resolved. - Continue oral iron supplementation daily. - Continue birth control pills. - Schedule outpatient IV iron infusion next week. - Encourage a high iron diet. - Monitor hemoglobin levels.  Menorrhagia Heavy menstrual bleeding likely contributing to anemia. Birth control initiated to manage bleeding. - Continue birth control pills. - Consider referral to OB/GYN if symptoms persist.  Eating Disorder/anxiety Signs of eating disorder with food restriction, weight concerns, and anxiety. Discussed Prozac and therapy benefits. - Start Prozac 10 mg daily. - Refer to psychology for therapy. - Check thyroid function tests. - Schedule follow-up in 4-6 weeks.  General Health Maintenance Potentially not up to date on vaccinations. - Review immunization status at next visit.    The ASCVD Risk score (Arnett DK, et al., 2019) failed to calculate for the following reasons:   The 2019 ASCVD risk score is only valid for ages 32 to 21  Past Medical History:  Diagnosis Date   Anemia    History reviewed. No pertinent surgical history. Social History   Tobacco Use   Smoking status: Never   Smokeless tobacco: Never  Vaping Use   Vaping status: Never Used  Substance Use Topics   Alcohol use: Not Currently   Drug use: Not Currently   History reviewed. No pertinent family history. No Known Allergies  ROS    Objective:    BP (!) 115/58   Pulse 83   Temp 97.6 F (36.4 C)   Ht 5\' 3"  (1.6 m)   Wt 106 lb 3.2 oz (48.2 kg)   LMP 06/02/2023 (Approximate)   SpO2 100%   BMI 18.81 kg/m  BP Readings from Last 3 Encounters:  06/16/23 (!) 115/58  06/13/23 (!) 119/58  08/17/21 118/65   Wt Readings from Last 3 Encounters:  06/16/23 106 lb 3.2 oz (48.2 kg)  06/12/23 103 lb 12.8 oz (47.1 kg)  07/08/21 109 lb 6.4 oz (49.6 kg) (18%, Z= -0.92)*   * Growth percentiles are based on CDC (Girls, 2-20 Years) data.     Physical Exam Vitals and nursing note reviewed.  Constitutional:      Appearance: Normal appearance.  HENT:     Head: Normocephalic.     Right Ear: Tympanic membrane, ear canal and external ear normal.     Left Ear: Tympanic membrane, ear canal and external ear normal.  Eyes:     Extraocular Movements: Extraocular movements intact.     Conjunctiva/sclera: Conjunctivae normal.     Pupils: Pupils are equal, round, and reactive to light.  Cardiovascular:     Rate and Rhythm: Normal rate and regular rhythm.     Heart sounds: Normal heart sounds.  Pulmonary:     Effort: Pulmonary effort is normal.     Breath sounds: Normal breath sounds.  Musculoskeletal:     Right lower leg: No edema.     Left lower leg: No edema.  Skin:    Comments: Swelling noted left arm near antecubital fossa from previous IV, slightly indurated, no streaking noted.   Neurological:     General: No focal deficit present.     Mental Status: She is alert and oriented to person, place, and time.  Psychiatric:  Mood and Affect: Mood normal.        Behavior: Behavior normal.        Thought Content: Thought content normal.        Judgment: Judgment normal.      No results found for any visits on 06/16/23.

## 2023-06-21 ENCOUNTER — Ambulatory Visit: Admitting: *Deleted

## 2023-06-21 VITALS — BP 91/54 | HR 62 | Temp 98.1°F | Resp 16 | Ht 63.0 in | Wt 106.0 lb

## 2023-06-21 DIAGNOSIS — N920 Excessive and frequent menstruation with regular cycle: Secondary | ICD-10-CM | POA: Diagnosis not present

## 2023-06-21 DIAGNOSIS — D509 Iron deficiency anemia, unspecified: Secondary | ICD-10-CM | POA: Diagnosis not present

## 2023-06-21 DIAGNOSIS — D500 Iron deficiency anemia secondary to blood loss (chronic): Secondary | ICD-10-CM

## 2023-06-21 MED ORDER — SODIUM CHLORIDE 0.9 % IV SOLN
510.0000 mg | Freq: Once | INTRAVENOUS | Status: AC
  Administered 2023-06-21: 510 mg via INTRAVENOUS
  Filled 2023-06-21: qty 17

## 2023-06-21 NOTE — Patient Instructions (Signed)

## 2023-06-21 NOTE — Progress Notes (Signed)
 Diagnosis: Iron  Deficiency Anemia  Provider:  Mannam, Praveen MD  Procedure: IV Infusion  IV Type: Peripheral, IV Location: R Antecubital  Feraheme (Ferumoxytol), Dose: 510 mg  Infusion Start Time: 1454 pm  Infusion Stop Time: 1519 pm  Post Infusion IV Care: Observation period completed and Peripheral IV Discontinued  Discharge: Condition: Good, Destination: Home . AVS Provided  Performed by:  Mayme Spearman, RN

## 2023-07-02 ENCOUNTER — Other Ambulatory Visit: Payer: Self-pay | Admitting: Student

## 2023-07-04 NOTE — Telephone Encounter (Signed)
 NOT [facility] PATIENT

## 2023-07-25 ENCOUNTER — Encounter: Payer: Self-pay | Admitting: Family Medicine

## 2023-07-25 ENCOUNTER — Ambulatory Visit: Admitting: Family Medicine

## 2023-07-25 VITALS — BP 108/58 | HR 81 | Temp 98.2°F | Ht 63.0 in | Wt 108.4 lb

## 2023-07-25 DIAGNOSIS — D500 Iron deficiency anemia secondary to blood loss (chronic): Secondary | ICD-10-CM | POA: Diagnosis not present

## 2023-07-25 DIAGNOSIS — F419 Anxiety disorder, unspecified: Secondary | ICD-10-CM

## 2023-07-25 MED ORDER — FERROUS SULFATE 325 (65 FE) MG PO TBEC: 325.0000 mg | DELAYED_RELEASE_TABLET | Freq: Every day | ORAL | 0 refills | Status: AC

## 2023-07-25 NOTE — Progress Notes (Signed)
 Patient Office Visit  Assessment & Plan:  Anxiety -     TSH  Iron  deficiency anemia due to chronic blood loss -     CBC with Differential/Platelet -     Iron , TIBC and Ferritin Panel  Other orders -     Ferrous Sulfate ; Take 1 tablet (325 mg total) by mouth daily. Can take every other day if causes persistent diarrhea.  Dispense: 90 tablet; Refill: 0   Assessment and Plan    Dizziness Intermittent dizziness persists but is less frequent. - Ensure adequate hydration and nutrition.  Iron  Deficiency Anemia Post-transfusion hemoglobin improved from 7 to 10. Increased appetite and decreased pica indicate improvement. Continues oral iron  supplementation. - Order blood work to recheck hemoglobin and iron  levels. - Continue oral iron  supplementation with a 90-day refill. - Advise on dietary fiber intake to prevent constipation from iron  supplementation.  Depression Improved mood and social interaction with current Prozac  10 mg regimen. She is apprehensive about therapy but open to trying it. No increase in Prozac  dosage desired. - Continue Prozac  10 mg daily. - Encourage initiation of therapy and provide support for concerns. - Schedule follow-up in three months to reassess mental health status.       Return in about 3 months (around 10/25/2023), or if symptoms worsen or fail to improve.   Subjective:    Patient ID: Kathryn Andrade, female    DOB: 2005-11-14  Age: 18 y.o. MRN: [id number]  Chief Complaint  Patient presents with   Medical Management of Chronic Issues    HPI Discussed the use of AI scribe software for clinical note transcription with the patient, who gave verbal consent to proceed.  History of Present Illness        History of Present Illness Kathryn Andrade is a 18 year old female who presents for follow-up regarding her mental health and anemia management.  She feels better since her last visit, noting weight gain and increased happiness. She is  nervous about starting therapy, having received a form via email, but is hesitant due to fear of revisiting past experiences. Despite this, she is considering trying therapy. She continues to take Prozac  10 mg daily, which she feels has improved her mood, making her feel more 'bubbly' and extroverted. Her mother has noticed she seems happier and more talkative.  She experienced dizziness since her last visit, though it occurs less frequently now. She has been eating more regularly, consuming small meals throughout the day, and staying hydrated. She was in the ER on June 1st for syncope and received a blood transfusion at that time. Her hemoglobin was 7 at the time of her ER visit and was 10 at a subsequent check after her transfusion. She continues to take iron  supplements and no longer craves ice, which she previously did when her iron  was low. She feels more energetic and has a better appetite, eating more frequently and not experiencing heavy menstrual bleeding as before.  She recently quit her job, which she describes as a positive change, providing her with a sense of independence and reduced stress. She is currently looking for another job but is prioritizing her well-being first. Her friends have noticed she is eating more and interacting more socially, which is a change from her previous behavior of isolating herself. She feels more balanced socially, engaging in both introverted and extroverted activities.  No longer experiencing heavy menstrual bleeding, though she does have some aches. She denies being vegetarian and confirms she is  getting enough protein in her diet. She is taking iron  supplements.  Results LABS Hemoglobin: 10 (06/12/2023)  Assessment and Plan Dizziness much improved Intermittent dizziness persists but is less frequent. - Ensure adequate hydration and nutrition.  Iron  Deficiency Anemia Post-transfusion hemoglobin improved from 7 to 10. Increased appetite and decreased  pica indicate improvement. Continues oral iron  supplementation. - Order blood work to recheck hemoglobin and iron  levels. - Continue oral iron  supplementation with a 90-day refill. - Advise on dietary fiber intake to prevent constipation from iron  supplementation.  Depression/anxiety Improved mood and social interaction with current Prozac  10 mg regimen. She is apprehensive about psychotherapy but open to trying it. No increase in Prozac  dosage desired. - Continue Prozac  10 mg daily. - Encourage initiation of therapy and provide support for concerns. - Schedule follow-up in three months to reassess mental health status.    The ASCVD Risk score (Arnett DK, et al., 2019) failed to calculate for the following reasons:   The 2019 ASCVD risk score is only valid for ages 73 to 37  Past Medical History:  Diagnosis Date   Anemia    History reviewed. No pertinent surgical history. Social History   Tobacco Use   Smoking status: Never   Smokeless tobacco: Never  Vaping Use   Vaping status: Never Used  Substance Use Topics   Alcohol use: Not Currently   Drug use: Not Currently   History reviewed. No pertinent family history. No Known Allergies  ROS    Objective:    BP (!) 108/58   Pulse 81   Temp 98.2 F (36.8 C)   Ht 5' 3 (1.6 m)   Wt 108 lb 6 oz (49.2 kg)   LMP 07/02/2023 (Exact Date)   SpO2 99%   BMI 19.20 kg/m  BP Readings from Last 3 Encounters:  07/25/23 (!) 108/58  06/21/23 (!) 91/54  06/16/23 (!) 115/58   Wt Readings from Last 3 Encounters:  07/25/23 108 lb 6 oz (49.2 kg)  06/21/23 106 lb (48.1 kg)  06/16/23 106 lb 3.2 oz (48.2 kg)    Physical Exam Vitals and nursing note reviewed.  Constitutional:      Appearance: Normal appearance.  HENT:     Head: Normocephalic.     Right Ear: Tympanic membrane, ear canal and external ear normal.     Left Ear: Tympanic membrane, ear canal and external ear normal.  Eyes:     Extraocular Movements: Extraocular  movements intact.     Pupils: Pupils are equal, round, and reactive to light.  Cardiovascular:     Rate and Rhythm: Normal rate and regular rhythm.     Heart sounds: Normal heart sounds.  Pulmonary:     Effort: Pulmonary effort is normal.     Breath sounds: Normal breath sounds.  Musculoskeletal:     Right lower leg: No edema.     Left lower leg: No edema.  Neurological:     General: No focal deficit present.     Mental Status: She is alert and oriented to person, place, and time.  Psychiatric:        Mood and Affect: Mood normal.        Behavior: Behavior normal.        Thought Content: Thought content normal.        Judgment: Judgment normal.      No results found for any visits on 07/25/23.

## 2023-07-26 ENCOUNTER — Ambulatory Visit: Payer: Self-pay | Admitting: Family Medicine

## 2023-07-26 LAB — CBC WITH DIFFERENTIAL/PLATELET
Absolute Lymphocytes: 1356 {cells}/uL (ref 850–3900)
Absolute Monocytes: 384 {cells}/uL (ref 200–950)
Basophils Absolute: 42 {cells}/uL (ref 0–200)
Basophils Relative: 0.7 %
Eosinophils Absolute: 48 {cells}/uL (ref 15–500)
Eosinophils Relative: 0.8 %
HCT: 38.3 % (ref 35.0–45.0)
Hemoglobin: 12.4 g/dL (ref 11.7–15.5)
MCH: 26.1 pg — ABNORMAL LOW (ref 27.0–33.0)
MCHC: 32.4 g/dL (ref 32.0–36.0)
MCV: 80.5 fL (ref 80.0–100.0)
Monocytes Relative: 6.4 %
Neutro Abs: 4170 {cells}/uL (ref 1500–7800)
Neutrophils Relative %: 69.5 %
Platelets: 183 Thousand/uL (ref 140–400)
RBC: 4.76 Million/uL (ref 3.80–5.10)
Total Lymphocyte: 22.6 %
WBC: 6 Thousand/uL (ref 3.8–10.8)

## 2023-07-26 LAB — IRON,TIBC AND FERRITIN PANEL
%SAT: 46 % — ABNORMAL HIGH (ref 16–45)
Ferritin: 225 ng/mL — ABNORMAL HIGH (ref 16–154)
Iron: 225 ng/mL — AB (ref 40–190)
TIBC: 309 ug/dL (ref 250–450)

## 2023-07-26 LAB — TSH: TSH: 2.08 m[IU]/L

## 2023-08-23 ENCOUNTER — Ambulatory Visit: Admitting: Family Medicine

## 2023-08-23 ENCOUNTER — Encounter: Payer: Self-pay | Admitting: Family Medicine

## 2023-08-23 VITALS — BP 100/60 | HR 69 | Temp 98.1°F | Ht 63.0 in | Wt 110.4 lb

## 2023-08-23 DIAGNOSIS — H6123 Impacted cerumen, bilateral: Secondary | ICD-10-CM | POA: Diagnosis not present

## 2023-08-23 DIAGNOSIS — H938X1 Other specified disorders of right ear: Secondary | ICD-10-CM

## 2023-08-23 NOTE — Progress Notes (Signed)
 Patient Office Visit  Assessment & Plan:  Bilateral impacted cerumen -     Ear Lavage  Clogged ear, right   Assessment and Plan    Impacted cerumen, right ear worse Right ear clogged with cerumen, likely from Q-tip use, causing fullness and tinnitus. - Attempt to clean out the impacted cerumen from the right ear with ear lavage- some cerumen came out but not all.   Partially Impacted cerumen, left ear Left ear has some cerumen, less clogged than right, no pain or infection. - Consider cleaning out the impacted cerumen from the left ear if time permits.      Patient will use over-the-counter Debrox for the next week or so and then let us  know if we need to do another ear lavage here.  Patient knows not to use Q-tips.    No follow-ups on file.   Subjective:    Patient ID: Kathryn Andrade, female    DOB: 03-Sep-2005  Age: 18 y.o. MRN: [id number]  Chief Complaint  Patient presents with   Ear Pain    Pt c/o of R ear pain x 2 days. She states that she was cleaning her ears with a q-tip and went too far. She noticed a popping noise and now is experiencing ringing.    HPI Discussed the use of AI scribe software for clinical note transcription with the patient, who gave verbal consent to proceed.  History of Present Illness        Kathryn Andrade is a 18 year old female who presents with right ear discomfort and hearing loss after using a Q-tip.  She used a Q-tip in her right ear on Sunday night, which she believes was inserted too deeply. Since then, she has experienced discomfort and a sensation of blockage in the ear. Initially, there was significant pain on Monday, but this has subsided to occasional 'shocks' and a persistent ringing sound. No blood or drainage from the ear.   Her hearing in the right ear is impaired, described as feeling 'like a little earplug' is present. No bleeding or discharge from the ear has been observed. No fever or chills.  She went  swimming outdoors on Sunday, but has not been swimming frequently this summer. Her father speculated that the wax might have been pushed deeper into the ear canal. Physical Exam HEENT: Cerumen present in both ear canals. No blood in right ear canal. Results Assessment & Plan Impacted cerumen, right ear worse Right ear clogged with cerumen, likely from Q-tip use, causing fullness and tinnitus. - Attempt to clean out the impacted cerumen from the right ear with ear lavage- some cerumen came out but not all.   Partially Impacted cerumen, left ear Left ear has some cerumen, less clogged than right, no pain or infection. - Consider cleaning out the impacted cerumen from the left ear if time permits.    The ASCVD Risk score (Arnett DK, et al., 2019) failed to calculate for the following reasons:   The 2019 ASCVD risk score is only valid for ages 20 to 47  Past Medical History:  Diagnosis Date   Anemia    History reviewed. No pertinent surgical history. Social History   Tobacco Use   Smoking status: Never   Smokeless tobacco: Never  Vaping Use   Vaping status: Never Used  Substance Use Topics   Alcohol use: Not Currently   Drug use: Not Currently   History reviewed. No pertinent family history. No Known Allergies  ROS  Objective:    BP 100/60   Pulse 69   Temp 98.1 F (36.7 C)   Ht 5' 3 (1.6 m)   Wt 110 lb 6 oz (50.1 kg)   LMP 07/29/2023 (Approximate)   SpO2 99%   BMI 19.55 kg/m  BP Readings from Last 3 Encounters:  08/23/23 100/60  07/25/23 (!) 108/58  06/21/23 (!) 91/54   Wt Readings from Last 3 Encounters:  08/23/23 110 lb 6 oz (50.1 kg)  07/25/23 108 lb 6 oz (49.2 kg)  06/21/23 106 lb (48.1 kg)    Physical Exam Vitals and nursing note reviewed.  Constitutional:      General: She is not in acute distress.    Appearance: Normal appearance.  HENT:     Head: Normocephalic.     Right Ear: There is impacted cerumen.     Left Ear: There is impacted  cerumen.     Ears:     Comments: After lavage right TM not visualized- cerumen was removed but not all.  Patient states that she could hear much better from the right side.  Left TM partially obstructed, no pain with tugging pinna bilaterally.  Eyes:     Extraocular Movements: Extraocular movements intact.     Pupils: Pupils are equal, round, and reactive to light.  Cardiovascular:     Rate and Rhythm: Normal rate and regular rhythm.     Heart sounds: Normal heart sounds.  Pulmonary:     Effort: Pulmonary effort is normal.     Breath sounds: Normal breath sounds.  Musculoskeletal:     Right lower leg: No edema.     Left lower leg: No edema.  Neurological:     General: No focal deficit present.     Mental Status: She is alert and oriented to person, place, and time.  Psychiatric:        Mood and Affect: Mood normal.        Behavior: Behavior normal.      No results found for any visits on 08/23/23.

## 2023-10-25 ENCOUNTER — Ambulatory Visit: Admitting: Family Medicine

## 2023-10-25 ENCOUNTER — Encounter: Payer: Self-pay | Admitting: Family Medicine

## 2023-10-25 VITALS — BP 104/62 | HR 83 | Temp 98.2°F | Ht 63.0 in | Wt 106.5 lb

## 2023-10-25 DIAGNOSIS — D500 Iron deficiency anemia secondary to blood loss (chronic): Secondary | ICD-10-CM | POA: Diagnosis not present

## 2023-10-25 DIAGNOSIS — F419 Anxiety disorder, unspecified: Secondary | ICD-10-CM

## 2023-10-25 DIAGNOSIS — F32A Depression, unspecified: Secondary | ICD-10-CM

## 2023-10-25 NOTE — Progress Notes (Signed)
 Patient Office Visit  Assessment & Plan:  Anxiety and depression  Iron  deficiency anemia due to chronic blood loss -     CBC with Differential/Platelet -     Vitamin B12 -     Iron , TIBC and Ferritin Panel   Assessment and Plan    Depression Depression persists due to inconsistent Prozac  adherence. No dosage increase desired. Engages in stress-reducing activities. - Recommend using a pill box to improve medication adherence. - Continue current Prozac  dosage with refill provided. - Encourage continuation of stress-reducing activities such as playing musical instruments.  Iron  deficiency anemia Anemia improving with increased energy, reduced dizziness, lighter menstrual cycles, and cessation of ice cravings. - Check iron  levels in the lab.  General Health Maintenance Declined flu vaccination despite recommendation. - Discuss and offer flu vaccination at future visits.      Return in about 3 months (around 01/25/2024), or if symptoms worsen or fail to improve.   Subjective:    Patient ID: Kathryn Andrade, female    DOB: 2005-04-09  Age: 18 y.o. MRN: [id number]  Chief Complaint  Patient presents with   Medical Management of Chronic Issues    HPI Discussed the use of AI scribe software for clinical note transcription with the patient, who gave verbal consent to proceed.  History of Present Illness        Kathryn Andrade is a 18 year old female with depression and iron  deficiency anemia who presents for follow-up on her medication management and symptoms.   She experiences ongoing stress related to her job search, which she finds overwhelming and contributes to her depression. She tries not to focus on it too much as it causes her to feel stressed and depressed. She reports that she is eating regularly, but sometimes stress from her job search affects her appetite.  She is currently taking Prozac  for her depression but admits to inconsistent use, taking it about  50% of the time due to forgetfulness. Her mother often reminds her to take her medication. The longest she has gone without taking it is two days. She does not use a pillbox, which might help with adherence.  She has been experiencing symptoms related to iron  deficiency anemia, including occasional dizziness, but notes an improvement in her energy levels and a decrease in dizziness frequency. Her menstrual cycles have become lighter, moving from heavy to medium flow, and she no longer craves ice, which she used to do.  In terms of lifestyle, she plays musical instruments like the guitar and flute to help manage stress. She does not socialize much and prefers to wait for others to approach her for friendship. She sleeps regular hours, approximately eight hours per night, and tries to eat three meals a day, although sometimes she only manages two.  Assessment and Plan Depression Depression persists due to inconsistent Prozac  adherence. No dosage increase desired. Engages in stress-reducing activities. - Recommend using a pill box to improve medication adherence. Patient aware that taking medication daily is recommended - Continue current Prozac  dosage with refill provided.  - Encourage continuation of stress-reducing activities such as playing musical instruments.  Iron  deficiency anemia Anemia improving with increased energy, reduced dizziness, lighter menstrual cycles, and cessation of ice cravings. - Check iron  levels in the lab.  General Health Maintenance Declined flu vaccination despite recommendation. - Discuss and offer flu vaccination at future visits.    The ASCVD Risk score (Arnett DK, et al., 2019) failed to calculate for the following reasons:  The 2019 ASCVD risk score is only valid for ages 69 to 32  Past Medical History:  Diagnosis Date   Anemia    History reviewed. No pertinent surgical history. Social History   Tobacco Use   Smoking status: Never   Smokeless  tobacco: Never  Vaping Use   Vaping status: Never Used  Substance Use Topics   Alcohol use: Not Currently   Drug use: Not Currently   History reviewed. No pertinent family history. No Known Allergies  ROS    Objective:    BP 104/62   Pulse 83   Temp 98.2 F (36.8 C)   Ht 5' 3 (1.6 m)   Wt 106 lb 8 oz (48.3 kg)   SpO2 98%   BMI 18.87 kg/m  BP Readings from Last 3 Encounters:  10/25/23 104/62  08/23/23 100/60  07/25/23 (!) 108/58   Wt Readings from Last 3 Encounters:  10/25/23 106 lb 8 oz (48.3 kg)  08/23/23 110 lb 6 oz (50.1 kg)  07/25/23 108 lb 6 oz (49.2 kg)    Physical Exam Vitals and nursing note reviewed.  Constitutional:      General: She is not in acute distress.    Appearance: Normal appearance.  HENT:     Head: Normocephalic.     Right Ear: There is impacted cerumen.     Left Ear: There is impacted cerumen.     Ears:     Comments: Tms obscured by cerumen.  Eyes:     Extraocular Movements: Extraocular movements intact.     Pupils: Pupils are equal, round, and reactive to light.  Cardiovascular:     Rate and Rhythm: Normal rate and regular rhythm.     Heart sounds: Normal heart sounds.  Pulmonary:     Effort: Pulmonary effort is normal.     Breath sounds: Normal breath sounds.  Musculoskeletal:     Right lower leg: No edema.     Left lower leg: No edema.  Neurological:     General: No focal deficit present.     Mental Status: She is alert and oriented to person, place, and time.  Psychiatric:        Attention and Perception: Attention normal.        Mood and Affect: Mood normal.        Speech: Speech normal.        Behavior: Behavior normal.        Thought Content: Thought content normal.        Cognition and Memory: Cognition normal.        Judgment: Judgment normal.      No results found for any visits on 10/25/23.

## 2023-10-26 LAB — CBC WITH DIFFERENTIAL/PLATELET
Absolute Lymphocytes: 1130 {cells}/uL (ref 850–3900)
Absolute Monocytes: 288 {cells}/uL (ref 200–950)
Basophils Absolute: 50 {cells}/uL (ref 0–200)
Basophils Relative: 1.1 %
Eosinophils Absolute: 41 {cells}/uL (ref 15–500)
Eosinophils Relative: 0.9 %
HCT: 34 % — ABNORMAL LOW (ref 35.0–45.0)
Hemoglobin: 10.7 g/dL — ABNORMAL LOW (ref 11.7–15.5)
MCH: 27.8 pg (ref 27.0–33.0)
MCHC: 31.5 g/dL — ABNORMAL LOW (ref 32.0–36.0)
MCV: 88.3 fL (ref 80.0–100.0)
MPV: 10.6 fL (ref 7.5–12.5)
Monocytes Relative: 6.4 %
Neutro Abs: 2993 {cells}/uL (ref 1500–7800)
Neutrophils Relative %: 66.5 %
Platelets: 277 Thousand/uL (ref 140–400)
RBC: 3.85 Million/uL (ref 3.80–5.10)
RDW: 12.3 % (ref 11.0–15.0)
Total Lymphocyte: 25.1 %
WBC: 4.5 Thousand/uL (ref 3.8–10.8)

## 2023-10-26 LAB — IRON,TIBC AND FERRITIN PANEL
%SAT: 6 % — ABNORMAL LOW (ref 16–45)
Ferritin: 5 ng/mL — ABNORMAL LOW (ref 16–154)
Iron: 26 ug/dL — ABNORMAL LOW (ref 40–190)
TIBC: 423 ug/dL (ref 250–450)

## 2023-10-26 LAB — VITAMIN B12: Vitamin B-12: 566 pg/mL (ref 200–1100)

## 2023-10-27 ENCOUNTER — Ambulatory Visit: Payer: Self-pay

## 2024-01-25 ENCOUNTER — Ambulatory Visit: Admitting: Family Medicine

## 2024-01-25 ENCOUNTER — Encounter: Payer: Self-pay | Admitting: Family Medicine

## 2024-01-25 VITALS — BP 110/52 | HR 70 | Temp 98.1°F | Ht 63.0 in | Wt 106.6 lb

## 2024-01-25 DIAGNOSIS — D500 Iron deficiency anemia secondary to blood loss (chronic): Secondary | ICD-10-CM | POA: Diagnosis not present

## 2024-01-25 DIAGNOSIS — F32A Depression, unspecified: Secondary | ICD-10-CM

## 2024-01-25 DIAGNOSIS — R5383 Other fatigue: Secondary | ICD-10-CM

## 2024-01-25 DIAGNOSIS — F419 Anxiety disorder, unspecified: Secondary | ICD-10-CM | POA: Diagnosis not present

## 2024-01-25 MED ORDER — FLUOXETINE HCL 20 MG PO CAPS: 20.0000 mg | ORAL_CAPSULE | Freq: Every day | ORAL | 1 refills | Status: AC

## 2024-01-25 NOTE — Progress Notes (Signed)
 "  Patient Office Visit  Assessment & Plan:  Other fatigue -     CBC with Differential/Platelet -     TSH -     Iron , TIBC and Ferritin Panel -     Comprehensive metabolic panel with GFR  Anxiety and depression -     FLUoxetine  HCl; Take 1 capsule (20 mg total) by mouth daily.  Dispense: 90 capsule; Refill: 1  Iron  deficiency anemia due to chronic blood loss -     Iron , TIBC and Ferritin Panel   Assessment and Plan    Iron  deficiency anemia due to chronic blood loss Iron  deficiency anemia likely due to chronic blood loss from heavy menstrual periods. Menstrual periods have become lighter since starting birth control.  Depression and anxiety Symptoms returned after discontinuing fluoxetine  10 mg. Discussed increasing fluoxetine  to 20 mg to improve symptoms. Considering therapy but hesitant to start. - Increased fluoxetine  to 20 mg daily. - Scheduled follow-up in 4-6 weeks to assess response to increased fluoxetine  dosage. - Encouraged consideration of therapy and to contact via MyChart if interested in starting therapy.  General Health Maintenance Declined flu vaccination today. - Encouraged flu vaccination in the future.      Increase the Prozac  to 20 mg once a day.  Follow-up on lab work notify patient.  Return in 4 to 6 weeks or sooner if necessary.  Return in about 5 weeks (around 02/29/2024), or if symptoms worsen or fail to improve.   Subjective:    Patient ID: Kathryn Andrade, female    DOB: 04-10-2005  Age: 19 y.o. MRN: [id number]  Chief Complaint  Patient presents with   Follow-up    3 month follow up on depression. Pt does think she is having issues with anemia.     HPI Discussed the use of AI scribe software for clinical note transcription with the patient, who gave verbal consent to proceed.      History of Present Illness Kathryn Andrade is a 19 year old female who presents with fatigue and recurrence of symptoms after pausing medication. Patient  backed off on the iron  and stopped Prozac .   She has been experiencing fatigue and a recurrence of symptoms since last month, which she attributes to pausing her medication. She stopped taking her iron  pill and fluoxetine , initially feeling well enough to manage without them, but noticed symptoms returning after cessation.  She was previously on 10 mg of fluoxetine , which she felt helped somewhat, but did not notice a significant difference upon stopping.  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 She describes the job as enjoyable but notes the stress of needing to sell memberships, which impacts her overall well-being.  Regarding her menstrual history, her periods were heavy in the past but have become lighter, lasting five to six days, since starting birth control. She was given a one-time prescription of Junel at the hospital, which she is not currently taking.  The iron  supplements have been causing stomach discomfort, affecting her ability to eat consistently. Despite this, she tries to maintain a healthy diet due to her job demands.  In terms of social history, she is not currently in a relationship and has not been sexually active. She has not received a flu shot yet and expresses some apprehension about it.  Assessment and Plan Iron  deficiency anemia due to chronic blood loss Iron  deficiency anemia  likely due to chronic blood loss from heavy menstrual periods. Menstrual periods have become lighter since starting birth control.  Depression and anxiety Symptoms returned after discontinuing fluoxetine  10 mg. Discussed increasing fluoxetine  to 20 mg to improve symptoms. Considering therapy but hesitant to start. - Increased fluoxetine  to 20 mg daily. - Scheduled follow-up in 4-6 weeks to assess response to increased fluoxetine  dosage. - Encouraged consideration of therapy and to  contact via MyChart if interested in starting therapy.  General Health Maintenance Declined flu vaccination today. - Encouraged flu vaccination in the future.    The ASCVD Risk score (Arnett DK, et al., 2019) failed to calculate for the following reasons:   The 2019 ASCVD risk score is only valid for ages 2 to 31   * - Cholesterol units were assumed  Past Medical History:  Diagnosis Date   Anemia    History reviewed. No pertinent surgical history. Social History[1] History reviewed. No pertinent family history. Allergies[2]  ROS    Objective:    BP (!) 110/52   Pulse 70   Temp 98.1 F (36.7 C)   Ht 5' 3 (1.6 m)   Wt 106 lb 9.6 oz (48.4 kg)   SpO2 97%   BMI 18.88 kg/m  BP Readings from Last 3 Encounters:  01/25/24 (!) 110/52  10/25/23 104/62  08/23/23 100/60   Wt Readings from Last 3 Encounters:  01/25/24 106 lb 9.6 oz (48.4 kg)  10/25/23 106 lb 8 oz (48.3 kg)  08/23/23 110 lb 6 oz (50.1 kg)    Physical Exam Vitals and nursing note reviewed.  Constitutional:      Appearance: Normal appearance.  HENT:     Head: Normocephalic.     Right Ear: Tympanic membrane, ear canal and external ear normal.     Left Ear: Tympanic membrane, ear canal and external ear normal.  Eyes:     Extraocular Movements: Extraocular movements intact.     Conjunctiva/sclera: Conjunctivae normal.     Pupils: Pupils are equal, round, and reactive to light.  Cardiovascular:     Rate and Rhythm: Normal rate and regular rhythm.     Heart sounds: Normal heart sounds.  Pulmonary:     Effort: Pulmonary effort is normal.     Breath sounds: Normal breath sounds.  Musculoskeletal:     Right lower leg: No edema.     Left lower leg: No edema.  Neurological:     General: No focal deficit present.     Mental Status: She is alert and oriented to person, place, and time.  Psychiatric:        Mood and Affect: Mood normal.        Behavior: Behavior normal.        Thought Content: Thought  content normal.        Judgment: Judgment normal.      No results found for any visits on 01/25/24.          [1]  Social History Tobacco Use   Smoking status: Never   Smokeless tobacco: Never  Vaping Use   Vaping status: Never Used  Substance Use Topics   Alcohol use: Not Currently   Drug use: Not Currently  [2] No Known Allergies  "

## 2024-01-27 LAB — CBC WITH DIFFERENTIAL/PLATELET
Absolute Lymphocytes: 1338 {cells}/uL (ref 850–3900)
Absolute Monocytes: 426 {cells}/uL (ref 200–950)
Basophils Absolute: 39 {cells}/uL (ref 0–200)
Basophils Relative: 0.7 %
Eosinophils Absolute: 62 {cells}/uL (ref 15–500)
Eosinophils Relative: 1.1 %
HCT: 30.9 % — ABNORMAL LOW (ref 35.9–46.0)
Hemoglobin: 9.3 g/dL — ABNORMAL LOW (ref 11.7–15.5)
MCH: 22.6 pg — ABNORMAL LOW (ref 27.0–33.0)
MCHC: 30.1 g/dL — ABNORMAL LOW (ref 31.6–35.4)
MCV: 75.2 fL — ABNORMAL LOW (ref 81.4–101.7)
MPV: 10.3 fL (ref 7.5–12.5)
Monocytes Relative: 7.6 %
Neutro Abs: 3735 {cells}/uL (ref 1500–7800)
Neutrophils Relative %: 66.7 %
Platelets: 293 Thousand/uL (ref 140–400)
RBC: 4.11 Million/uL (ref 3.80–5.10)
RDW: 15.9 % — ABNORMAL HIGH (ref 11.0–15.0)
Total Lymphocyte: 23.9 %
WBC: 5.6 Thousand/uL (ref 3.8–10.8)

## 2024-01-27 LAB — COMPREHENSIVE METABOLIC PANEL WITH GFR
AG Ratio: 1.8 (calc) (ref 1.0–2.5)
ALT: 8 U/L (ref 6–29)
AST: 11 U/L (ref 10–30)
Albumin: 4.5 g/dL (ref 3.6–5.1)
Alkaline phosphatase (APISO): 61 U/L (ref 31–125)
BUN: 8 mg/dL (ref 7–25)
CO2: 28 mmol/L (ref 20–32)
Calcium: 9.4 mg/dL (ref 8.6–10.2)
Chloride: 105 mmol/L (ref 98–110)
Creat: 0.54 mg/dL (ref 0.50–0.96)
Globulin: 2.5 g/dL (ref 1.9–3.7)
Glucose, Bld: 89 mg/dL (ref 65–99)
Potassium: 4.3 mmol/L (ref 3.5–5.3)
Sodium: 138 mmol/L (ref 135–146)
Total Bilirubin: 0.6 mg/dL (ref 0.2–1.2)
Total Protein: 7 g/dL (ref 6.1–8.1)
eGFR: 135 mL/min/1.73m2

## 2024-01-27 LAB — TSH: TSH: 1.54 m[IU]/L

## 2024-01-27 LAB — IRON,TIBC AND FERRITIN PANEL
%SAT: 3 % — ABNORMAL LOW (ref 16–45)
Ferritin: 3 ng/mL — ABNORMAL LOW (ref 16–154)
Iron: 14 ug/dL — ABNORMAL LOW (ref 40–190)
TIBC: 435 ug/dL (ref 250–450)

## 2024-01-30 ENCOUNTER — Ambulatory Visit: Payer: Self-pay | Admitting: Family Medicine

## 2024-02-29 ENCOUNTER — Ambulatory Visit: Admitting: Family Medicine

## 2024-05-10 IMAGING — US US PELVIS COMPLETE
1 series · 14 of 25 positions shown · non-contrast
Comparison: None Available.

CLINICAL DATA: Pelvic pain

EXAM:
TRANSABDOMINAL ULTRASOUND OF PELVIS
DOPPLER ULTRASOUND OF OVARIES
TECHNIQUE: Transabdominal ultrasound examination of the pelvis was performed
including evaluation of the uterus, ovaries, adnexal regions, and
pelvic cul-de-sac.
Color and duplex Doppler ultrasound was utilized to evaluate blood
flow to the ovaries.

[Series 1: gyn us · 14 of 43 slices shown]
[im 1/43]
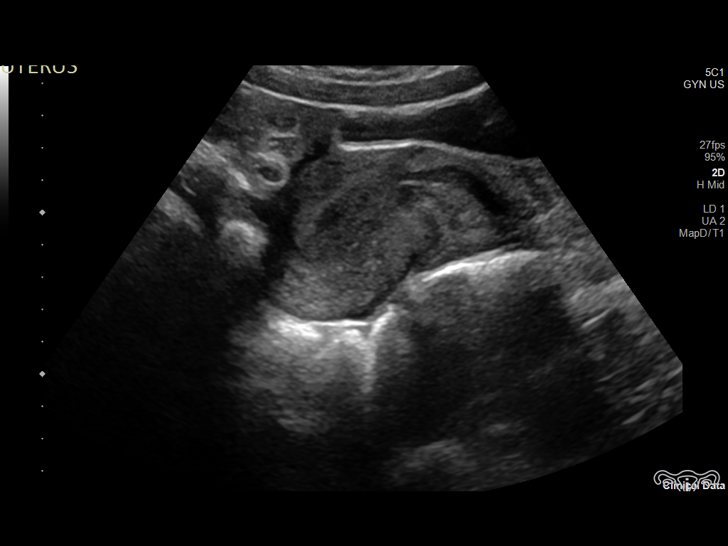
[im 4/43]
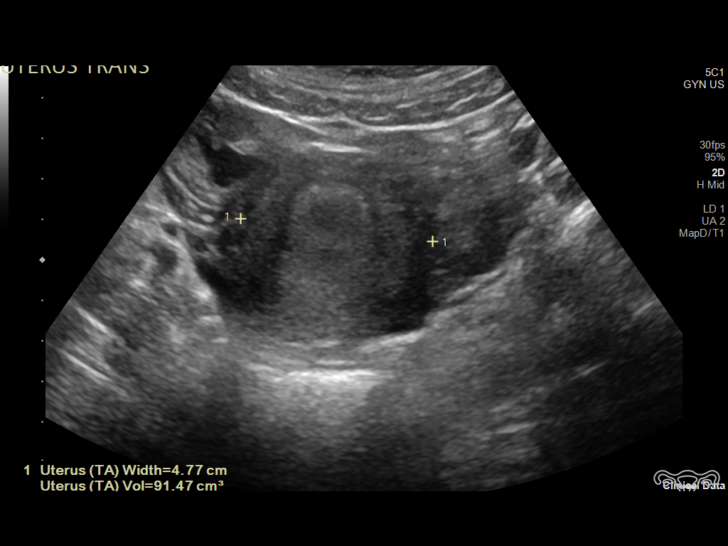
[im 8/43]
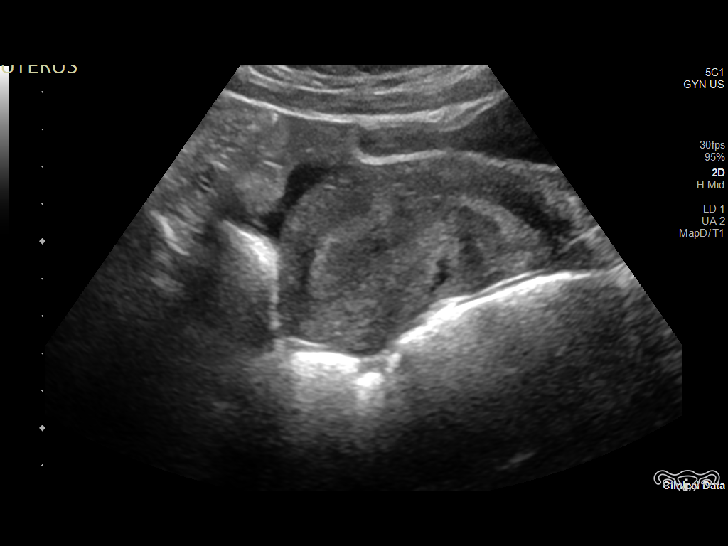
[im 11/43]
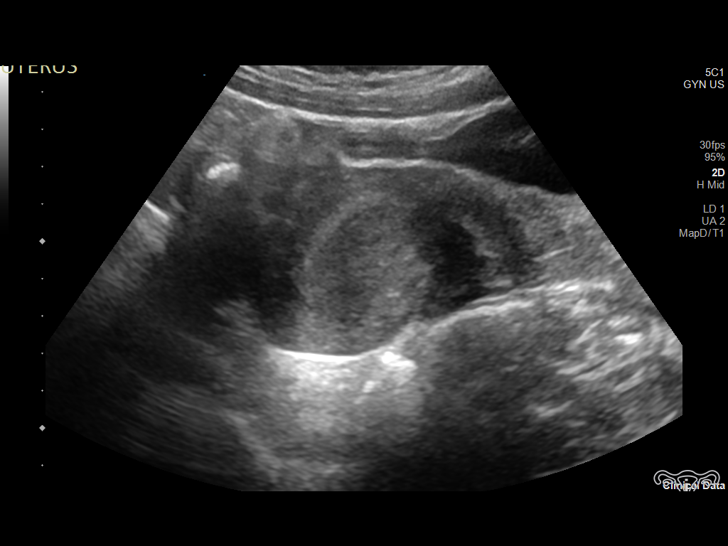
[im 15/43]
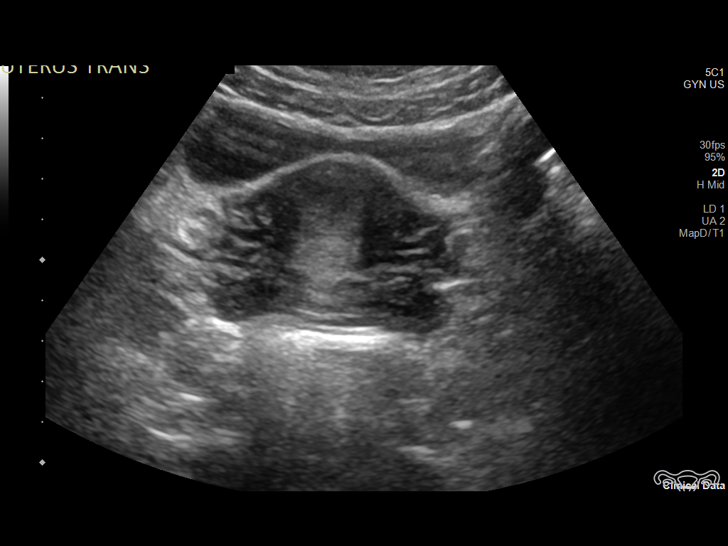
[im 16/43]
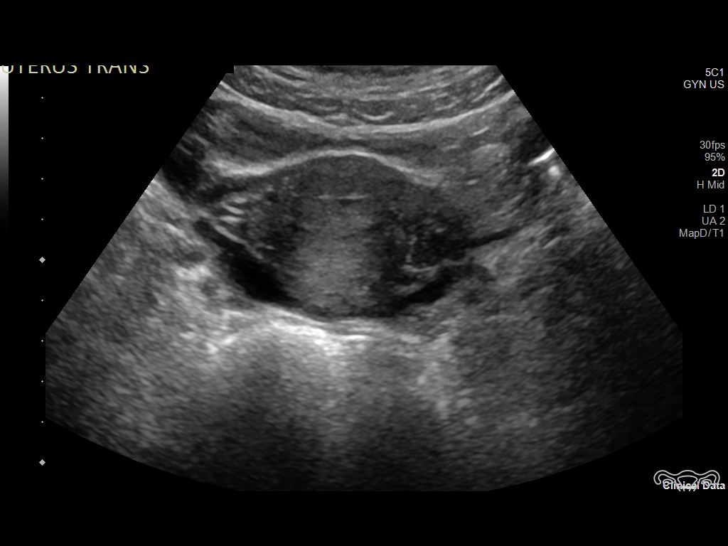
[im 20/43]
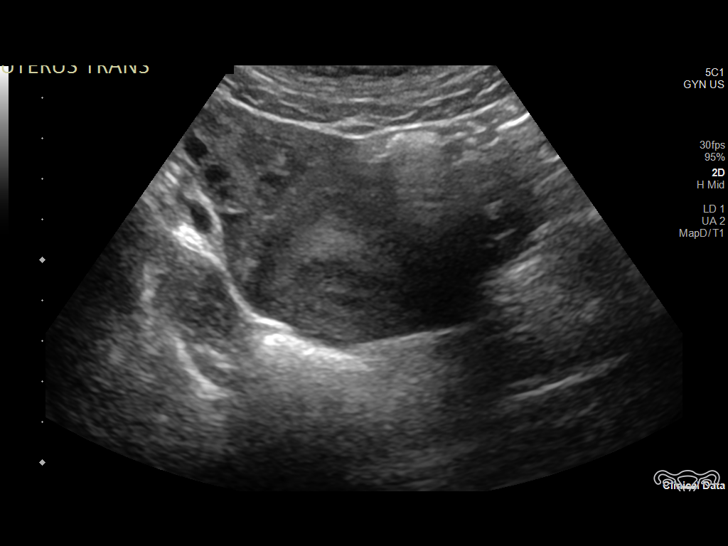
[im 23/43]
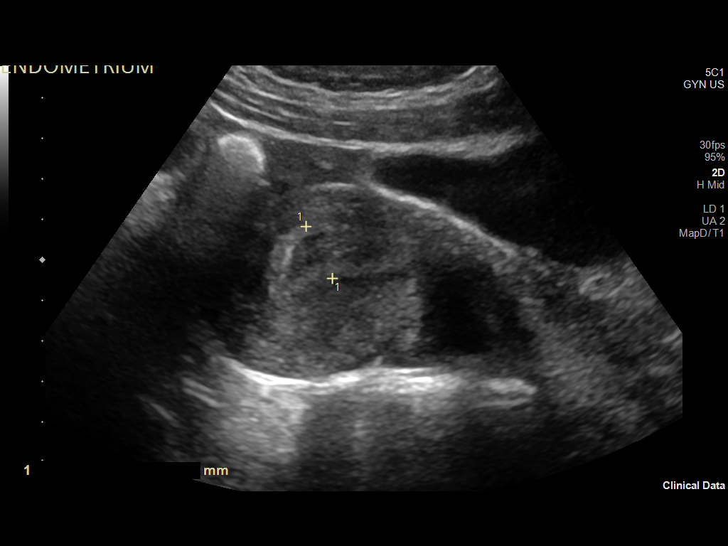
[im 27/43]
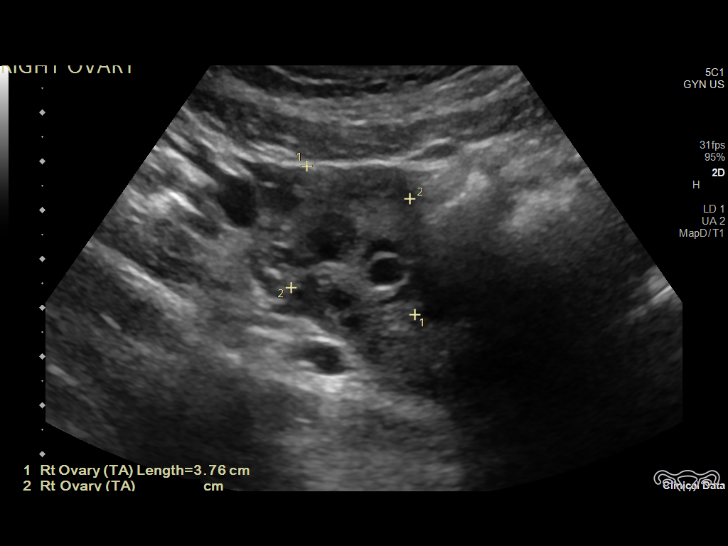
[im 29/43]
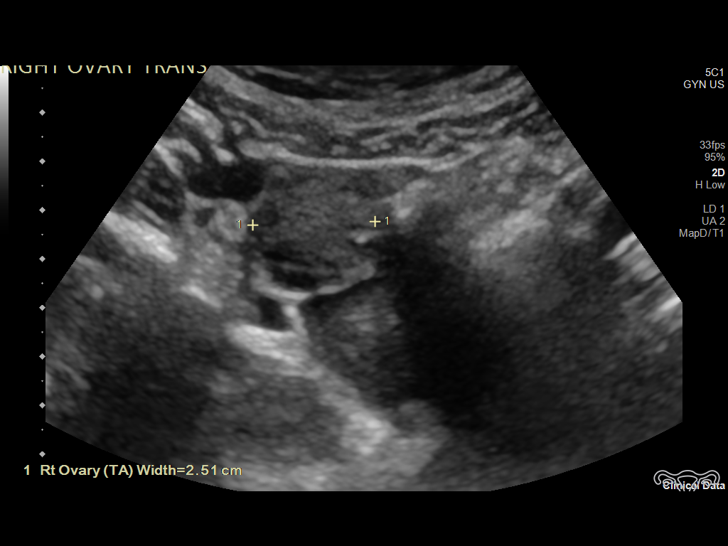
[im 32/43]
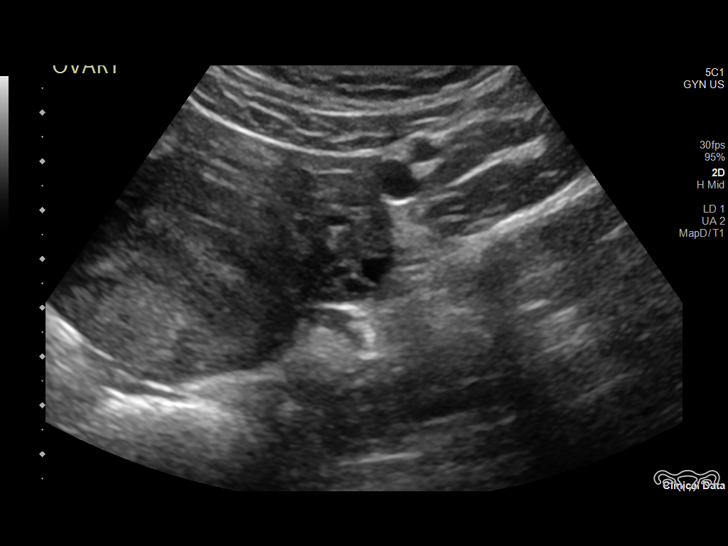
[im 36/43]
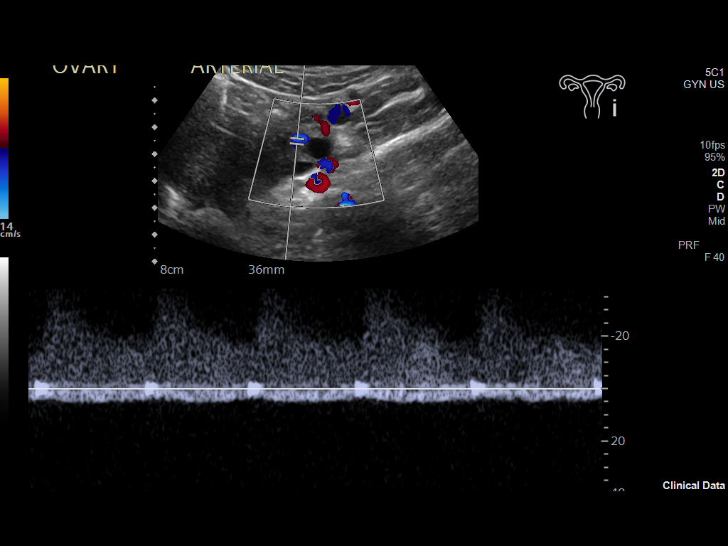
[im 39/43]
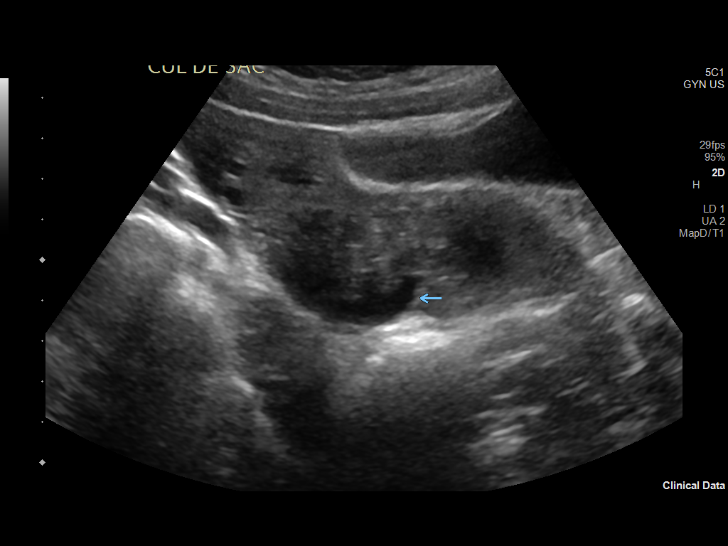
[im 43/43]
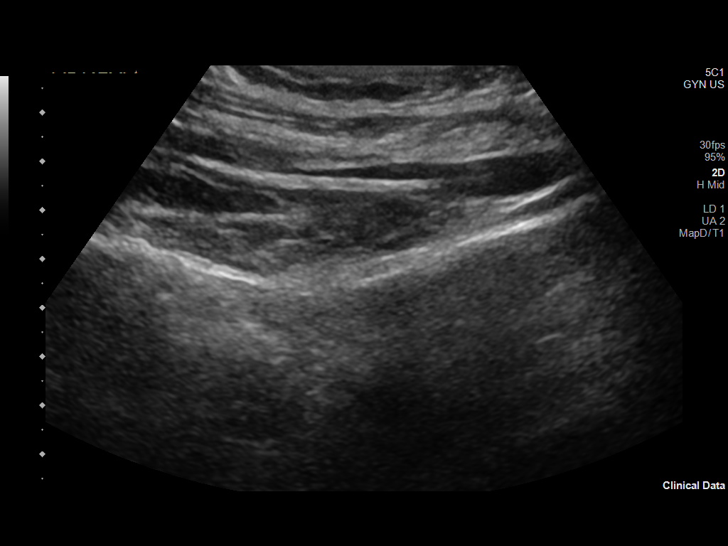

[14 of 25 positions shown; findings below may reference images not displayed]

FINDINGS: Uterus

Measurements: 8.2 x 4.5 x 4.8 cm = volume: 91.5 mL. No fibroids or
other mass visualized.

Endometrium

Thickness: 14 mm.  No focal abnormality visualized.

Right ovary

Measurements: 3.8 x 3.0 x 2.5 cm = volume: 15.0 mL. Normal follicles
are identified. No adnexal masses.

Left ovary

Measurements: 2.8 x 1.6 x 1.7 cm = volume: 4.0 mL. Normal follicles
are identified. No adnexal masses.

Pulsed Doppler evaluation demonstrates normal low-resistance
arterial and venous waveforms in both ovaries.

Other: Small volume of free fluid within the pelvis, may be
physiologic.
IMPRESSION: 1. Age-appropriate pelvic ultrasound.
2. Pelvic free fluid, likely physiologic.
